# Patient Record
Sex: Male | Born: 2000 | Hispanic: No | Marital: Single | Smoking: Never smoker
Health system: Southern US, Community
[De-identification: ages and names within clinical notes are randomized; demographics above are authoritative.]

## PROBLEM LIST (undated history)

## (undated) DIAGNOSIS — S060X9A Concussion with loss of consciousness of unspecified duration, initial encounter: Secondary | ICD-10-CM

## (undated) DIAGNOSIS — J45909 Unspecified asthma, uncomplicated: Secondary | ICD-10-CM

## (undated) DIAGNOSIS — S060XAA Concussion with loss of consciousness status unknown, initial encounter: Secondary | ICD-10-CM

---
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
## 2018-01-10 ENCOUNTER — Ambulatory Visit (HOSPITAL_COMMUNITY)
Admission: EM | Admit: 2018-01-10 | Discharge: 2018-01-10 | Disposition: A | Discharge status: Home / Self Care | Payer: Medicaid Other | Attending: Family Medicine | Admitting: Family Medicine

## 2018-01-10 DIAGNOSIS — L03116 Cellulitis of left lower limb: Secondary | ICD-10-CM | POA: Diagnosis not present

## 2018-01-10 DIAGNOSIS — R21 Rash and other nonspecific skin eruption: Secondary | ICD-10-CM | POA: Diagnosis present

## 2018-01-10 DIAGNOSIS — S80862A Insect bite (nonvenomous), left lower leg, initial encounter: Secondary | ICD-10-CM

## 2018-01-10 DIAGNOSIS — W57XXXA Bitten or stung by nonvenomous insect and other nonvenomous arthropods, initial encounter: Secondary | ICD-10-CM | POA: Diagnosis not present

## 2018-01-10 MED ORDER — DOXYCYCLINE HYCLATE 100 MG PO CAPS: 100.0000 mg | ORAL_CAPSULE | Freq: Two times a day (BID) | ORAL | 0 refills | Status: DC

## 2018-01-10 MED ORDER — SULFAMETHOXAZOLE-TRIMETHOPRIM 800-160 MG PO TABS: 1.0000 | ORAL_TABLET | Freq: Two times a day (BID) | ORAL | 0 refills | Status: AC

## 2018-01-10 NOTE — Discharge Instructions (Addendum)
Bandage applied Wound culture obtained.  We will follow up with you regarding your results Wash site daily with warm water and mild soap Keep covered to avoid friction Bactrim prescribed.  Take as directed and to completion Follow up here or with PCP if symptoms persists Return or go to the ED if you have any new or worsening symptoms such as worsening pain, nausea, vomiting, abdominal pain, fever, chills, increased redness, swelling, drainage, etc..Marland Kitchen

## 2018-01-10 NOTE — ED Triage Notes (Signed)
Pt states he has a insect bite left lower leg x 1 week.

## 2018-01-10 NOTE — ED Provider Notes (Signed)
[facility]   [id number] 01/10/18 Arrival Time: 1553  CC: Insect bite  SUBJECTIVE:  Timothy Bates is a 17 y.o. male who presents with a insect bite x 1 week.   Localizes the rash to left lower extremity.  Describes it as improving painful and red. Was seen at [facility] urgent care and treated with a shot and "something for pain."   Denies aggravating or alleviating factors.  Denies similar symptoms in the past. Complains of redness, swelling, and purulent drainage. Denies fever, chills, nausea, vomiting, swollen glands, SOB, chest pain, abdominal pain, changes in bowel or bladder function.    ROS: As per HPI.  Past Medical History:  Diagnosis Date  . Asthma   . Concussion    No past surgical history on file. No Known Allergies No current facility-administered medications on file prior to encounter.    Current Outpatient Medications on File Prior to Encounter  Medication Sig Dispense Refill  . LORazepam (ATIVAN) 0.5 MG tablet Take 1 tablet (0.5 mg total) by mouth every 6 (six) hours as needed for anxiety. 5 tablet 0   Social History   Socioeconomic History  . Marital status: Single    Spouse name: Not on file  . Number of children: Not on file  . Years of education: Not on file  . Highest education level: Not on file  Occupational History  . Not on file  Social Needs  . Financial resource strain: Not on file  . Food insecurity:    Worry: Not on file    Inability: Not on file  . Transportation needs:    Medical: Not on file    Non-medical: Not on file  Tobacco Use  . Smoking status: Never Smoker  . Smokeless tobacco: Never Used  Substance and Sexual Activity  . Alcohol use: No  . Drug use: Not on file  . Sexual activity: Not on file  Lifestyle  . Physical activity:    Days per week: Not on file    Minutes per session: Not on file  . Stress: Not on file  Relationships  . Social connections:    Talks on phone: Not on file    Gets together: Not  on file    Attends religious service: Not on file    Active member of club or organization: Not on file    Attends meetings of clubs or organizations: Not on file    Relationship status: Not on file  . Intimate partner violence:    Fear of current or ex partner: Not on file    Emotionally abused: Not on file    Physically abused: Not on file    Forced sexual activity: Not on file  Other Topics Concern  . Not on file  Social History Narrative  . Not on file   No family history on file.  OBJECTIVE: Vitals:   01/10/18 1612 01/10/18 1613 01/10/18 1614  BP: 121/71    Pulse: 72    Resp: 18    Temp:   98.6 F (37 C)  TempSrc:   Oral  SpO2: 100%    Weight:  235 lb (106.6 kg)     General appearance: alert; no distress; nontoxic appearance Lungs: clear to auscultation bilaterally Heart: regular rate and rhythm.  Radial pulse 2+ bilaterally Extremities: no edema Skin: warm and dry; 1 cm wound with 1-2 cm of surrounding erythema; tender to palpation; purulent drainage evident; wound culture obtained (see picture below) Psychological: alert and cooperative; normal  mood and affect      ASSESSMENT & PLAN:  1. Insect bite of left lower leg, initial encounter   2. Cellulitis of left lower extremity     Meds ordered this encounter  Medications  . DISCONTD: doxycycline (VIBRAMYCIN) 100 MG capsule    Sig: Take 1 capsule (100 mg total) by mouth 2 (two) times daily.    Dispense:  20 capsule    Refill:  0    Order Specific Question:   Supervising Provider    Answer:   Isa Rankin [phone]  . sulfamethoxazole-trimethoprim (BACTRIM DS,SEPTRA DS) 800-160 MG tablet    Sig: Take 1 tablet by mouth 2 (two) times daily for 10 days.    Dispense:  20 tablet    Refill:  0    Order Specific Question:   Supervising Provider    Answer:   Isa Rankin [[id number]]   Bandage applied Wound culture obtained.  We will follow up with you regarding your results Wash site daily with  warm water and mild soap Keep covered to avoid friction Bactrim prescribed.  Take as directed and to completion Follow up here or with PCP if symptoms persists Return or go to the ED if you have any new or worsening symptoms such as worsening pain, nausea, vomiting, abdominal pain, fever, chills, increased redness, swelling, drainage, etc...  Reviewed expectations re: course of current medical issues. Questions answered. Outlined signs and symptoms indicating need for more acute intervention. Patient verbalized understanding. After Visit Summary given.   Rennis Harding, PA-C 01/10/18 1751

## 2018-01-12 ENCOUNTER — Telehealth (HOSPITAL_COMMUNITY): Payer: Self-pay

## 2018-01-12 LAB — AEROBIC CULTURE W GRAM STAIN (SUPERFICIAL SPECIMEN)

## 2018-01-12 LAB — AEROBIC CULTURE  (SUPERFICIAL SPECIMEN)

## 2018-01-12 NOTE — Telephone Encounter (Signed)
Wound culture positive for Staphylococcus aureus. This was treated with Bactrim at visit. Patient reports wound looking better.

## 2018-10-11 ENCOUNTER — Ambulatory Visit (HOSPITAL_COMMUNITY)
Admission: EM | Admit: 2018-10-11 | Discharge: 2018-10-11 | Disposition: A | Discharge status: Home / Self Care | Payer: Medicaid Other | Attending: Emergency Medicine | Admitting: Emergency Medicine

## 2018-10-11 ENCOUNTER — Other Ambulatory Visit: Payer: Self-pay

## 2018-10-11 ENCOUNTER — Encounter (HOSPITAL_COMMUNITY): Payer: Self-pay

## 2018-10-11 DIAGNOSIS — W57XXXA Bitten or stung by nonvenomous insect and other nonvenomous arthropods, initial encounter: Secondary | ICD-10-CM | POA: Diagnosis not present

## 2018-10-11 DIAGNOSIS — M5441 Lumbago with sciatica, right side: Secondary | ICD-10-CM | POA: Diagnosis not present

## 2018-10-11 DIAGNOSIS — G8929 Other chronic pain: Secondary | ICD-10-CM

## 2018-10-11 DIAGNOSIS — S50862A Insect bite (nonvenomous) of left forearm, initial encounter: Secondary | ICD-10-CM | POA: Diagnosis not present

## 2018-10-11 MED ORDER — BACITRACIN ZINC 500 UNIT/GM EX OINT
TOPICAL_OINTMENT | CUTANEOUS | Status: AC
  Filled 2018-10-11: qty 0.9

## 2018-10-11 MED ORDER — SULFAMETHOXAZOLE-TRIMETHOPRIM 800-160 MG PO TABS: 1.0000 | ORAL_TABLET | Freq: Two times a day (BID) | ORAL | 0 refills | Status: AC

## 2018-10-11 NOTE — Discharge Instructions (Addendum)
Take the antibiotic as prescribed for 10 days.  Take ibuprofen as needed for your back pain.    Return here or go to the emergency room if you have increased pain, swelling, redness; or if you develop pus drainage, fever, chills, or other concerning symptoms.

## 2018-10-11 NOTE — ED Triage Notes (Signed)
Pt presents with swelling, redness, and pain to left forearm from insect bite that he noticed yesterday.  Pt also has complaints of back pain.
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
## 2019-01-20 ENCOUNTER — Ambulatory Visit: Payer: Medicaid Other | Admitting: Physical Therapy

## 2019-01-21 ENCOUNTER — Ambulatory Visit: Payer: Medicaid Other | Attending: Adult Medicine | Admitting: Physical Therapy

## 2020-11-17 ENCOUNTER — Other Ambulatory Visit: Payer: Self-pay | Admitting: Adult Medicine

## 2020-11-17 DIAGNOSIS — M545 Low back pain, unspecified: Secondary | ICD-10-CM

## 2020-12-04 ENCOUNTER — Other Ambulatory Visit: Payer: Medicaid Other

## 2021-04-21 ENCOUNTER — Emergency Department (HOSPITAL_BASED_OUTPATIENT_CLINIC_OR_DEPARTMENT_OTHER): Payer: Medicaid Other | Admitting: Radiology

## 2021-04-21 ENCOUNTER — Other Ambulatory Visit: Payer: Self-pay

## 2021-04-21 ENCOUNTER — Encounter (HOSPITAL_BASED_OUTPATIENT_CLINIC_OR_DEPARTMENT_OTHER): Payer: Self-pay

## 2021-04-21 ENCOUNTER — Emergency Department (HOSPITAL_BASED_OUTPATIENT_CLINIC_OR_DEPARTMENT_OTHER)
Admission: EM | Admit: 2021-04-21 | Discharge: 2021-04-21 | Disposition: A | Discharge status: Home / Self Care | Payer: Medicaid Other | Attending: Emergency Medicine | Admitting: Emergency Medicine

## 2021-04-21 DIAGNOSIS — M79602 Pain in left arm: Secondary | ICD-10-CM | POA: Insufficient documentation

## 2021-04-21 DIAGNOSIS — R072 Precordial pain: Secondary | ICD-10-CM | POA: Diagnosis not present

## 2021-04-21 DIAGNOSIS — J45909 Unspecified asthma, uncomplicated: Secondary | ICD-10-CM | POA: Insufficient documentation

## 2021-04-21 DIAGNOSIS — R202 Paresthesia of skin: Secondary | ICD-10-CM | POA: Diagnosis not present

## 2021-04-21 DIAGNOSIS — R079 Chest pain, unspecified: Secondary | ICD-10-CM | POA: Diagnosis present

## 2021-04-21 DIAGNOSIS — M79603 Pain in arm, unspecified: Secondary | ICD-10-CM

## 2021-04-21 LAB — BASIC METABOLIC PANEL
Anion gap: 8 (ref 5–15)
BUN: 15 mg/dL (ref 6–20)
CO2: 27 mmol/L (ref 22–32)
Calcium: 9.2 mg/dL (ref 8.9–10.3)
Chloride: 104 mmol/L (ref 98–111)
Creatinine, Ser: 0.99 mg/dL (ref 0.61–1.24)
GFR, Estimated: >60 mL/min (ref >60)
Glucose, Bld: 94 mg/dL (ref 70–99)
Potassium: 4.3 mmol/L (ref 3.5–5.1)
Sodium: 139 mmol/L (ref 135–145)

## 2021-04-21 LAB — CBC
HCT: 47.2 % (ref 39.0–52.0)
Hemoglobin: 16.5 g/dL (ref 13.0–17.0)
MCH: 30 pg (ref 26.0–34.0)
MCHC: 35 g/dL (ref 30.0–36.0)
MCV: 85.8 fL (ref 80.0–100.0)
Platelets: 221 10*3/uL (ref 150–400)
RBC: 5.5 MIL/uL (ref 4.22–5.81)
RDW: 12 % (ref 11.5–15.5)
WBC: 6.4 10*3/uL (ref 4.0–10.5)
nRBC: 0 % (ref 0.0–0.2)

## 2021-04-21 LAB — TROPONIN I (HIGH SENSITIVITY): Troponin I (High Sensitivity): <2 ng/L (ref <18)

## 2021-04-21 MED ORDER — DEXAMETHASONE SODIUM PHOSPHATE 10 MG/ML IJ SOLN
10.0000 mg | Freq: Once | INTRAMUSCULAR | Status: AC
Start: 2021-04-21 — End: 2021-04-21
  Administered 2021-04-21: 10 mg via INTRAMUSCULAR
  Filled 2021-04-21: qty 1

## 2021-04-21 MED ORDER — DICLOFENAC SODIUM 1 % EX GEL: 2.0000 g | Freq: Four times a day (QID) | CUTANEOUS | 0 refills | Status: AC

## 2021-04-21 MED ORDER — KETOROLAC TROMETHAMINE 30 MG/ML IJ SOLN
30.0000 mg | Freq: Once | INTRAMUSCULAR | Status: AC
  Administered 2021-04-21: 30 mg via INTRAMUSCULAR
  Filled 2021-04-21: qty 1

## 2021-04-21 MED ORDER — MELOXICAM 7.5 MG PO TABS: 7.5000 mg | ORAL_TABLET | Freq: Every day | ORAL | 0 refills | Status: DC

## 2021-04-21 NOTE — ED Notes (Signed)
Pt via pov from home with left shoulder, arm, hand pain that "hurts in my chest too." Pt describes pain as sharp, and fingers go numb occasionally. Pain started two nights ago. NAD noted.

## 2021-04-21 NOTE — ED Triage Notes (Signed)
Patient here POV from Home for CP.  Pain began 2 days PTA and has continued since. CP is generalized throughout Chest.  Associated with Lightheadedness, Upset Stomach, and Left Arm Pain.   No Nausea. No Vomiting.  NAD Noted during Triage. A&Ox4. GCS 15. Ambulatory.

## 2021-04-21 NOTE — ED Provider Notes (Signed)
Emergency Department Provider Note   I have reviewed the triage vital signs and the nursing notes.   HISTORY  Chief Complaint Chest Pain   HPI Timothy Bates is a 21 y.o. male with past history reviewed below presents emergency department with complaint of left arm discomfort.  Pain symptoms seem to be worst at the left elbow but is also having some discomfort when raising the left shoulder.  He is occasionally having radiation of pain into the chest all the states this is a more minor aspect of his complaint today.  He does feel some tingling in the fingertips.  No pain in the neck.  Denies any injuries.  No headaches.  No symptoms into the face or leg.  He does not have a very physical job.  He is active and plays golf regularly but denies any known injury. Symptoms developing over the last several days.    Past Medical History:  Diagnosis Date   Asthma    Concussion     Review of Systems  Constitutional: No fever/chills Eyes: No visual changes. ENT: No sore throat. Cardiovascular: Positive chest pain. Respiratory: Denies shortness of breath. Gastrointestinal: No abdominal pain.  No nausea, no vomiting.  No diarrhea.  No constipation. Genitourinary: Negative for dysuria. Musculoskeletal: Negative for back/neck pain. Positive left arm pain.  Skin: Negative for rash. Neurological: Negative for headaches, focal weakness or numbness. Positive tingling in the left arm.    ____________________________________________   PHYSICAL EXAM:  VITAL SIGNS: ED Triage Vitals  Enc Vitals Group     BP 04/21/21 1222 126/69     Pulse Rate 04/21/21 1222 70     Resp 04/21/21 1222 16     Temp 04/21/21 1222 98.8 F (37.1 C)     Temp src --      SpO2 04/21/21 1222 100 %     Weight 04/21/21 1216 225 lb (102.1 kg)     Height 04/21/21 1216 5\' 10"  (1.778 m)   Constitutional: Alert and oriented. Well appearing and in no acute distress. Eyes: Conjunctivae are normal.  Head:  Atraumatic. Nose: No congestion/rhinnorhea. Mouth/Throat: Mucous membranes are moist.   Neck: No stridor.  Cardiovascular: Normal rate, regular rhythm. Good peripheral circulation. Grossly normal heart sounds.   Respiratory: Normal respiratory effort.  No retractions. Lungs CTAB. Gastrointestinal: Soft and nontender. No distention.  Musculoskeletal: No lower extremity tenderness nor edema. No gross deformities of extremities.  Normal range of motion of the left elbow, wrist, shoulder.  No joint crepitus.  Tenderness to the olecranon and medial elbow.  No joint effusion, erythema, warmth.  Similarly no warmth/swelling to the left shoulder.  Neurologic:  Normal speech and language. Tingling noted in the left forearm/fingers. 5/5 strength bilaterally.  Skin:  Skin is warm, dry and intact. No rash noted.   ____________________________________________   LABS (all labs ordered are listed, but only abnormal results are displayed)  Labs Reviewed  BASIC METABOLIC PANEL  CBC  TROPONIN I (HIGH SENSITIVITY)   ____________________________________________  EKG   EKG Interpretation  Date/Time:  Thursday April 21 2021 12:19:29 EST Ventricular Rate:  73 PR Interval:  148 QRS Duration: 98 QT Interval:  364 QTC Calculation: 401 R Axis:   97 Text Interpretation: Normal sinus rhythm with sinus arrhythmia Rightward axis Borderline ECG When compared with ECG of 25-Jun-2015 23:33, No significant change was found Confirmed by Nanda Quinton [phone]) on 04/21/2021 12:25:09 PM        ____________________________________________  RADIOLOGY  DG Chest 2  View  Result Date: 04/21/2021 CLINICAL DATA:  Chest pain EXAM: CHEST - 2 VIEW COMPARISON:  06/16/2015 FINDINGS: The heart size and mediastinal contours are within normal limits. Both lungs are clear. The visualized skeletal structures are unremarkable. IMPRESSION: No active cardiopulmonary disease. Electronically Signed   By: Davina Poke D.O.    On: 04/21/2021 12:52   DG Elbow Complete Left  Result Date: 04/21/2021 CLINICAL DATA:  Left shoulder, arm and hand pain EXAM: LEFT ELBOW - COMPLETE 3+ VIEW COMPARISON:  None. FINDINGS: There is no evidence of fracture, dislocation, or joint effusion. There is no evidence of arthropathy or other focal bone abnormality. Soft tissues are unremarkable. IMPRESSION: Normal radiographs. Electronically Signed   By: Nelson Chimes M.D.   On: 04/21/2021 14:14   DG Shoulder Left  Result Date: 04/21/2021 CLINICAL DATA:  Left shoulder, arm and hand pain. EXAM: LEFT SHOULDER - 2+ VIEW COMPARISON:  None. FINDINGS: There is no evidence of fracture or dislocation. There is no evidence of arthropathy or other focal bone abnormality. Soft tissues are unremarkable. IMPRESSION: Normal radiographs. Electronically Signed   By: Nelson Chimes M.D.   On: 04/21/2021 14:15    ____________________________________________   PROCEDURES  Procedure(s) performed:   Procedures  None  ____________________________________________   INITIAL IMPRESSION / ASSESSMENT AND PLAN / ED COURSE  Pertinent labs & imaging results that were available during my care of the patient were reviewed by me and considered in my medical decision making (see chart for details).   This patient is Presenting for Evaluation of CP, which does require a range of treatment options, and is a complaint that involves a high risk of morbidity and mortality.  The Differential Diagnoses includes all life-threatening causes for chest pain. This includes but is not exclusive to acute coronary syndrome, aortic dissection, pulmonary embolism, cardiac tamponade, community-acquired pneumonia, pericarditis, musculoskeletal chest wall pain, etc.   Critical Interventions- IM pain and steroid medications.    Medications  ketorolac (TORADOL) 30 MG/ML injection 30 mg (30 mg Intramuscular Given 04/21/21 1408)  dexamethasone (DECADRON) injection 10 mg (10 mg  Intramuscular Given 04/21/21 1408)    Reassessment after intervention: Pain improved.    I did obtain Additional Historical Information from SO at bedside who supports timeline.   I decided to review pertinent External Data, and in summary no recent ED visits for similar.   Clinical Laboratory Tests Ordered, included troponin which is within normal limits.  Basic metabolic and CBC similarly normal.  Radiologic Tests Ordered, included CXR and plain films of the left shoulder and elbow. I independently interpreted the images and agree with radiology interpretation.   Cardiac Monitor Tracing which shows NSR.   Social Determinants of Health Risk patient is a non-smoker.   Medical Decision Making: Summary:  Patient presents emergency department mainly with what appears to be musculoskeletal type pain in the left arm.  Suspect repetitive movement type injury, possibly golfers elbow, with some compressive peripheral neuropathy causing tingling in the fingers.  My exam and history do not support stroke or critical spine impingement/injury.  His chest pain seems to refer from the arm and my suspicion for ACS, PE, dissection in this patient is exceedingly low.  His EKG, interpreted by me as above, is reassuring and troponin is negative here.  Plan for anti-inflammatories and single dose of Decadron here.  Patient given contact information for sports medicine for follow-up.   Disposition: discharge   ____________________________________________  FINAL CLINICAL IMPRESSION(S) / ED DIAGNOSES  Final diagnoses:  Precordial chest pain  Left arm pain  Paresthesias     NEW OUTPATIENT MEDICATIONS STARTED DURING THIS VISIT:  Discharge Medication List as of 04/21/2021  2:48 PM     START taking these medications   Details  diclofenac Sodium (VOLTAREN) 1 % GEL Apply 2 g topically 4 (four) times daily., Starting Thu 04/21/2021, Normal        Note:  This document was prepared using Dragon voice  recognition software and may include unintentional dictation errors.  Nanda Quinton, MD, [facility]    Latara Micheli, Wonda Old[facility], MD 04/22/21 [phone]

## 2021-04-21 NOTE — Discharge Instructions (Signed)
We believe that your symptoms are caused by musculoskeletal strain or pinched nerve in the arm.  Please read through the included information about additional care such as heating pads, over-the-counter pain medicine.  If you were provided a prescription please use it only as needed and as instructed.  Remember that early mobility and using the affected part of your body is actually better than keeping it immobile.  Follow-up with the doctor listed as recommended or return to the emergency department with new or worsening symptoms that concern you.

## 2021-04-27 ENCOUNTER — Ambulatory Visit (INDEPENDENT_AMBULATORY_CARE_PROVIDER_SITE_OTHER): Payer: Medicaid Other | Admitting: Family Medicine

## 2021-04-27 ENCOUNTER — Encounter: Payer: Self-pay | Admitting: Family Medicine

## 2021-04-27 VITALS — BP 118/70 | Ht 70.0 in | Wt 225.0 lb

## 2021-04-27 DIAGNOSIS — M533 Sacrococcygeal disorders, not elsewhere classified: Secondary | ICD-10-CM

## 2021-04-27 DIAGNOSIS — M5412 Radiculopathy, cervical region: Secondary | ICD-10-CM

## 2021-04-27 MED ORDER — MELOXICAM 7.5 MG PO TABS: 7.5000 mg | ORAL_TABLET | Freq: Two times a day (BID) | ORAL | 1 refills | Status: AC | PRN

## 2021-04-27 MED ORDER — HYDROCODONE-ACETAMINOPHEN 5-325 MG PO TABS: 1.0000 | ORAL_TABLET | Freq: Three times a day (TID) | ORAL | 0 refills | Status: AC | PRN
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
## 2021-04-27 NOTE — Patient Instructions (Signed)
Nice to meet you Please try heat  Please try the exercises  Please consider the compression   Please use the pain medicine as needed Please send me a message in MyChart with any questions or updates.  Please see me back in 2-3 weeks.   --Dr. Jaslin Novitski  
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
## 2021-04-27 NOTE — Assessment & Plan Note (Signed)
Acutely occurring.  Symptoms most consistent with radicular origin on the left arm. -Counseled on home exercise therapy and supportive care. -Meloxicam. -Norco. -Could consider physical therapy or further imaging.

## 2021-05-11 ENCOUNTER — Ambulatory Visit (INDEPENDENT_AMBULATORY_CARE_PROVIDER_SITE_OTHER): Payer: Medicaid Other | Admitting: Family Medicine

## 2021-05-11 ENCOUNTER — Ambulatory Visit: Payer: Self-pay

## 2021-05-11 ENCOUNTER — Encounter: Payer: Self-pay | Admitting: Family Medicine

## 2021-05-11 VITALS — BP 144/70 | Ht 70.0 in | Wt 225.0 lb

## 2021-05-11 DIAGNOSIS — G5622 Lesion of ulnar nerve, left upper limb: Secondary | ICD-10-CM

## 2021-05-11 DIAGNOSIS — M25522 Pain in left elbow: Secondary | ICD-10-CM

## 2021-05-11 DIAGNOSIS — M533 Sacrococcygeal disorders, not elsewhere classified: Secondary | ICD-10-CM | POA: Diagnosis not present

## 2021-05-11 DIAGNOSIS — M5412 Radiculopathy, cervical region: Secondary | ICD-10-CM | POA: Diagnosis not present

## 2021-05-11 DIAGNOSIS — S336XXD Sprain of sacroiliac joint, subsequent encounter: Secondary | ICD-10-CM

## 2021-05-11 DIAGNOSIS — S336XXA Sprain of sacroiliac joint, initial encounter: Secondary | ICD-10-CM | POA: Diagnosis not present

## 2021-05-11 MED ORDER — METHYLPREDNISOLONE ACETATE 40 MG/ML IJ SUSP
40.0000 mg | Freq: Once | INTRAMUSCULAR | Status: AC
  Administered 2021-05-11: 40 mg via INTRA_ARTICULAR

## 2021-05-11 MED ORDER — TRIAMCINOLONE ACETONIDE 40 MG/ML IJ SUSP
40.0000 mg | Freq: Once | INTRAMUSCULAR | Status: AC
  Administered 2021-05-11: 40 mg via INTRA_ARTICULAR
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
## 2021-05-11 NOTE — Addendum Note (Signed)
Addended by: Cresenciano Lick on: 05/11/2021 01:28 PM   Modules accepted: Orders

## 2021-05-11 NOTE — Assessment & Plan Note (Signed)
Acutely occurring.  Having pain throughout the arm but has improved somewhat.  Seems more localized to the shoulder and elbow. -Counseled on home exercise therapy and supportive care. -Could consider physical therapy.

## 2021-05-11 NOTE — Assessment & Plan Note (Signed)
Acutely occurring.  Having predominantly the pain at the elbow and runs distal to his hand.  Mildly thickened ulnar nerve out of the cubital tunnel. -Counseled on home exercise therapy and supportive care. -Injection today.
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
## 2021-05-11 NOTE — Patient Instructions (Signed)
Good to see you Please try heat on the back  Please continue the exercises   Please send me a message in MyChart with any questions or updates.  Please see me back in 4 weeks.   --Dr. Jordan Likes

## 2021-06-08 ENCOUNTER — Ambulatory Visit: Payer: Medicaid Other | Admitting: Family Medicine

## 2021-11-08 ENCOUNTER — Encounter: Payer: Self-pay | Admitting: Family Medicine

## 2021-11-08 ENCOUNTER — Ambulatory Visit (INDEPENDENT_AMBULATORY_CARE_PROVIDER_SITE_OTHER): Payer: Medicaid Other | Admitting: Family Medicine

## 2021-11-08 ENCOUNTER — Ambulatory Visit (HOSPITAL_BASED_OUTPATIENT_CLINIC_OR_DEPARTMENT_OTHER)
Admission: RE | Admit: 2021-11-08 | Discharge: 2021-11-08 | Disposition: A | Discharge status: Home / Self Care | Payer: Medicaid Other | Source: Ambulatory Visit | Attending: Family Medicine | Admitting: Family Medicine

## 2021-11-08 VITALS — BP 118/77 | Ht 70.0 in | Wt 225.0 lb

## 2021-11-08 DIAGNOSIS — M5412 Radiculopathy, cervical region: Secondary | ICD-10-CM

## 2021-11-08 DIAGNOSIS — M533 Sacrococcygeal disorders, not elsewhere classified: Secondary | ICD-10-CM | POA: Insufficient documentation

## 2021-11-08 MED ORDER — GABAPENTIN 300 MG PO CAPS: 300.0000 mg | ORAL_CAPSULE | Freq: Three times a day (TID) | ORAL | 1 refills | Status: AC

## 2021-11-08 MED ORDER — PREDNISONE 5 MG PO TABS: ORAL_TABLET | ORAL | 0 refills | Status: AC

## 2021-11-08 NOTE — Assessment & Plan Note (Signed)
Acute on chronic in nature.  Having more right-sided type pain. -Counseled on home exercise therapy and supportive care. -X-ray. -Could consider physical therapy or further imaging.

## 2021-11-08 NOTE — Assessment & Plan Note (Signed)
Acutely occurring.  Having pain at this originating at the neck with periscapular pain and right radicular pain in the arm. -Counseled on home exercise therapy and supportive care. -X-ray. -Gabapentin -Prednisone. -Consider further imaging.

## 2021-11-08 NOTE — Progress Notes (Signed)
  Timothy Bates - 21 y.o. male MRN [id number]  Date of birth: 06/03/2000  SUBJECTIVE:  Including CC & ROS.  No chief complaint on file.   Timothy Bates is a 21 y.o. male that is presenting with acute right periscapular pain and arm pain with neck pain.  Pain is been ongoing for the past few weeks.  No improvement with meloxicam.  Acute on chronic lower back pain.    Review of Systems See HPI   HISTORY: Past Medical, Surgical, Social, and Family History Reviewed & Updated per EMR.   Pertinent Historical Findings include:  Past Medical History:  Diagnosis Date   Asthma    Concussion     History reviewed. No pertinent surgical history.   PHYSICAL EXAM:  VS: BP 118/77 (BP Location: Left Arm, Patient Position: Sitting)   Ht 5\' 10"  (1.778 m)   Wt 225 lb (102.1 kg)   BMI 32.28 kg/m  Physical Exam Gen: NAD, alert, cooperative with exam, well-appearing MSK:  Neurovascularly intact       ASSESSMENT & PLAN:   Cervical radiculopathy Acutely occurring.  Having pain at this originating at the neck with periscapular pain and right radicular pain in the arm. -Counseled on home exercise therapy and supportive care. -X-ray. -Gabapentin -Prednisone. -Consider further imaging.  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  Having more right-sided type pain. -Counseled on home exercise therapy and supportive care. -X-ray. -Could consider physical therapy or further imaging.

## 2021-11-08 NOTE — Patient Instructions (Signed)
Good to see you Please use heat  Please try the exercises I will call with the xray results.  Please start with one pill of gabapentin at night. You can increase this to 2 or 3 times daily as you tolerate   Please send me a message in MyChart with any questions or updates.  Please see me back in 4 weeks.   --Dr. Jordan Likes

## 2021-11-11 ENCOUNTER — Telehealth: Payer: Self-pay | Admitting: Family Medicine

## 2021-11-11 NOTE — Telephone Encounter (Signed)
Left VM for patient. If he calls back please have him speak with a nurse/CMA and inform that his xrays are reassuring. Xrays do not show the whole picture so we'll continue treatment and can take next steps in either physical therapy or MRIs.   If any questions then please take the best time and phone number to call and I will try to call him back.   Myra Rude, MD [facility] 11/11/2021, 12:12 PM

## 2021-12-06 ENCOUNTER — Ambulatory Visit: Payer: Medicaid Other | Admitting: Family Medicine

## 2022-07-10 ENCOUNTER — Encounter: Payer: Self-pay | Admitting: *Deleted

## 2023-10-14 IMAGING — DX DG ELBOW COMPLETE 3+V*L*
4 series · 4 of 4 positions shown · non-contrast
Comparison: None.

CLINICAL DATA: Left shoulder, arm and hand pain

EXAM:
LEFT ELBOW - COMPLETE 3+ VIEW

[elbow ap]
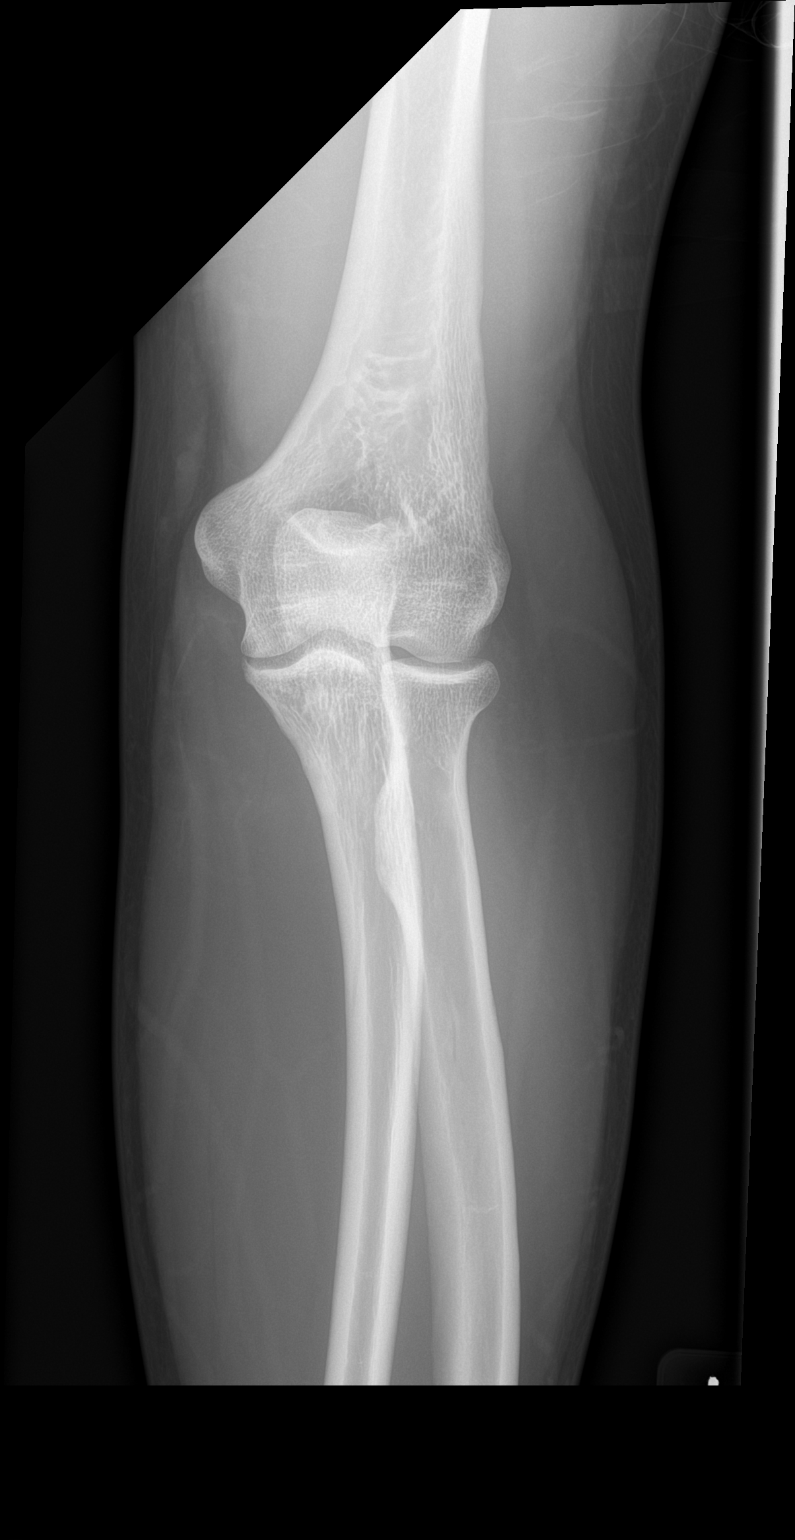

[elbow obl (1 of 2)]
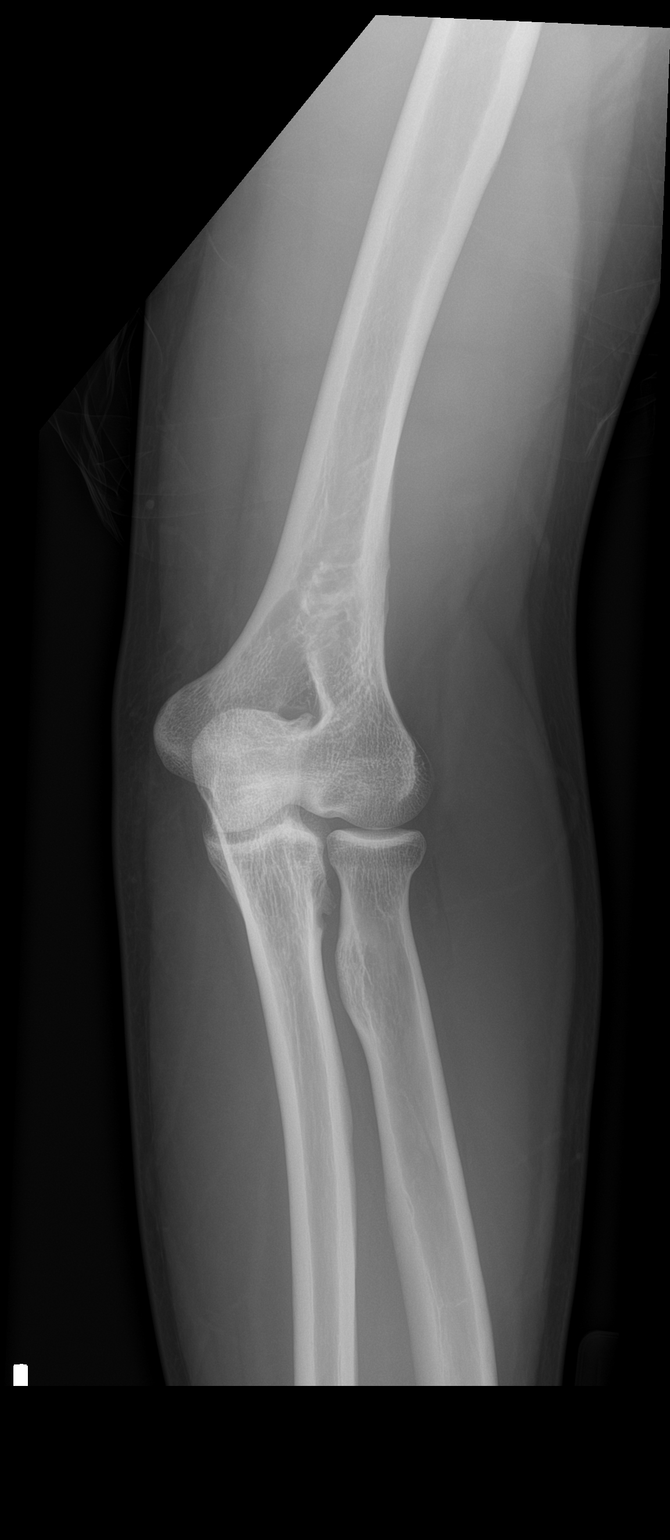

[elbow obl (2 of 2)]
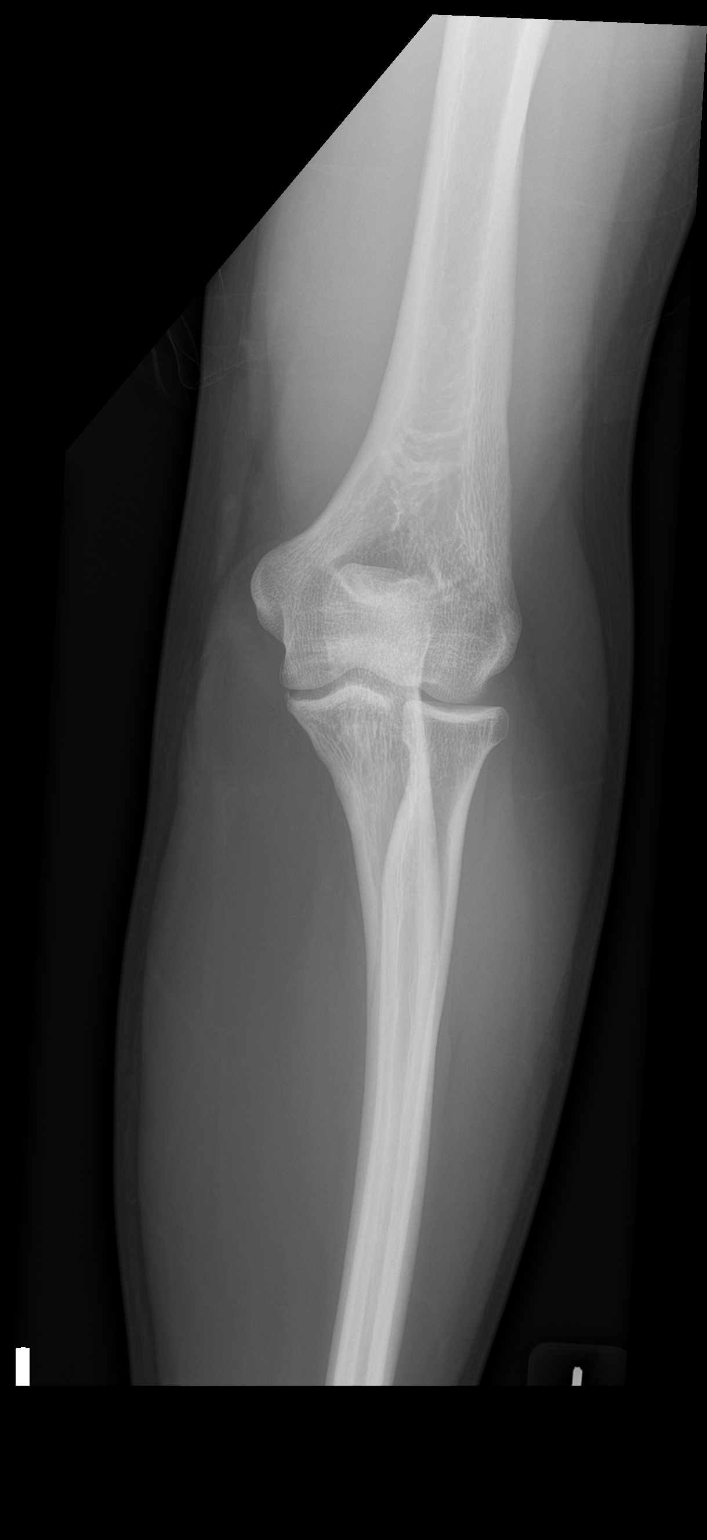

[elbow lat]
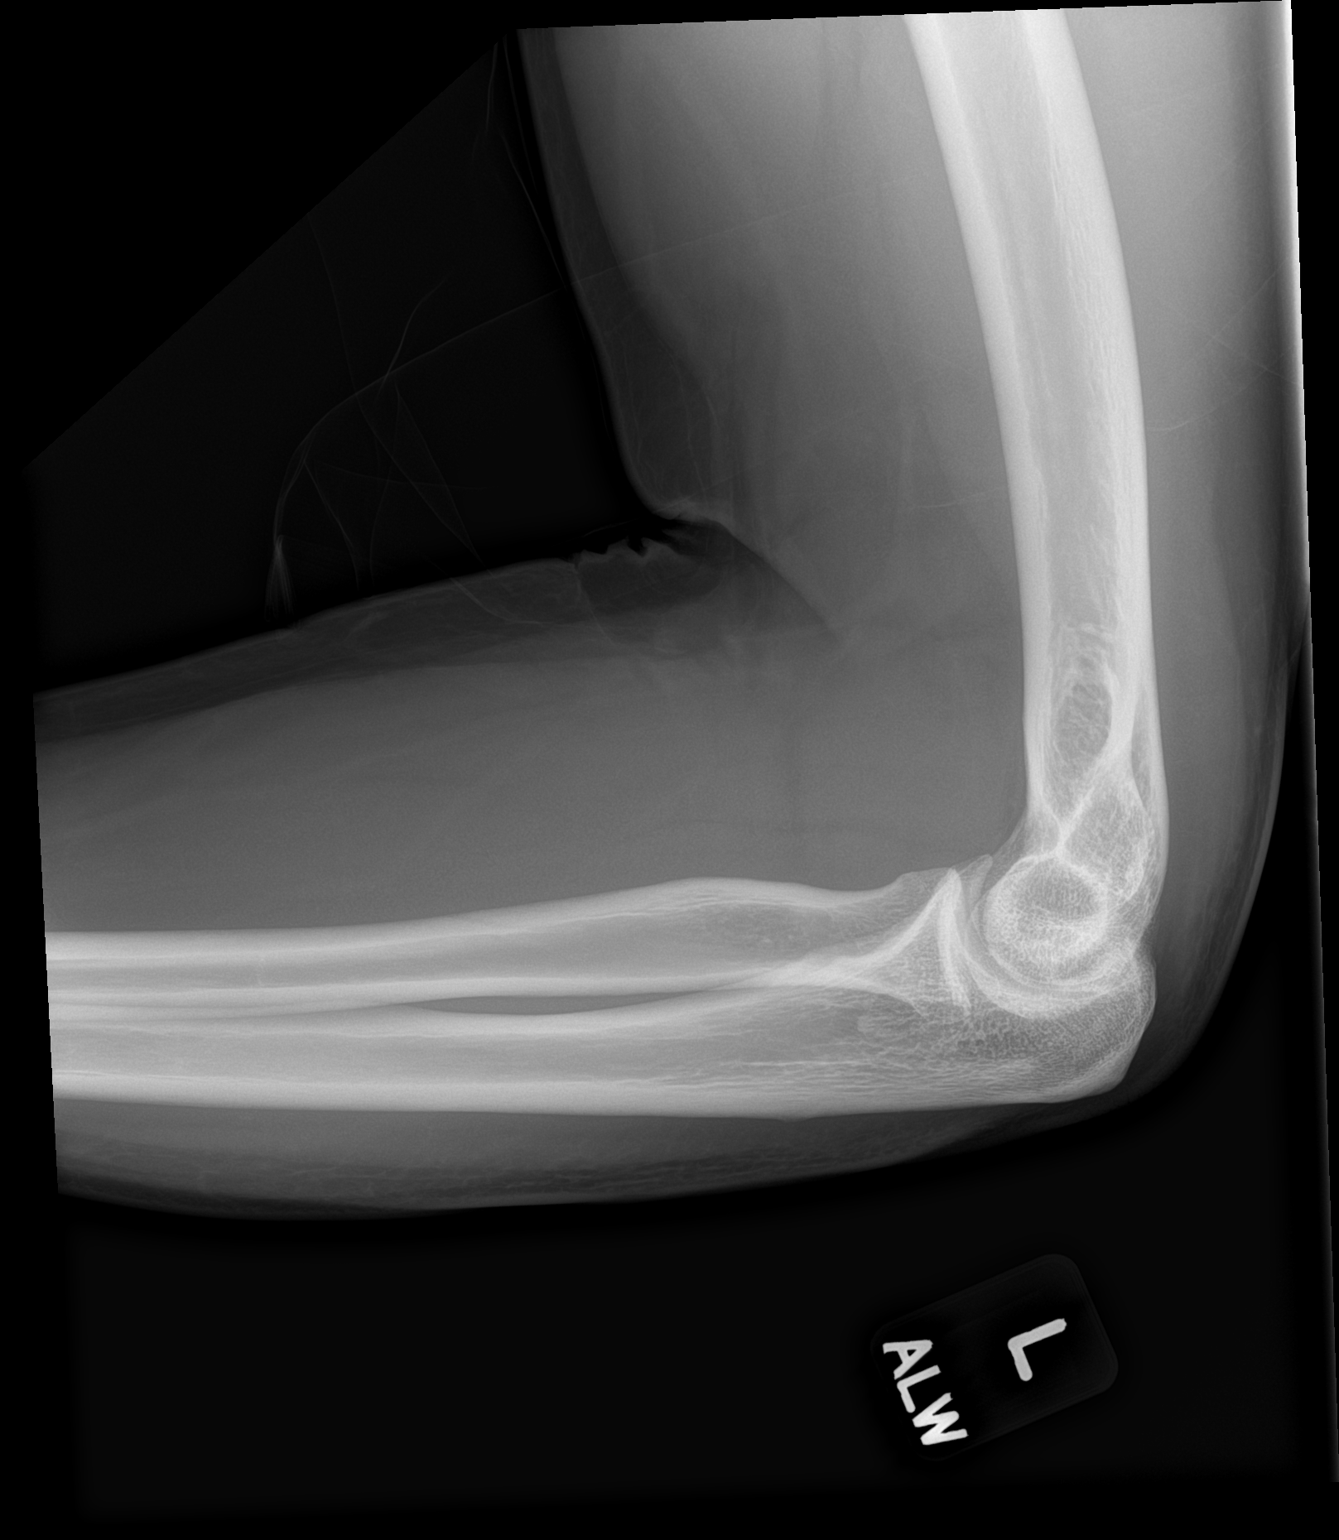

[4 of 4 positions shown; findings below may reference images not displayed]

FINDINGS: There is no evidence of fracture, dislocation, or joint effusion.
There is no evidence of arthropathy or other focal bone abnormality.
Soft tissues are unremarkable.
IMPRESSION: Normal radiographs.

## 2023-10-14 IMAGING — DX DG SHOULDER 2+V*L*
4 series · 4 of 4 positions shown · non-contrast
Comparison: None.

CLINICAL DATA: Left shoulder, arm and hand pain.

EXAM:
LEFT SHOULDER - 2+ VIEW

[shoulder grashey]
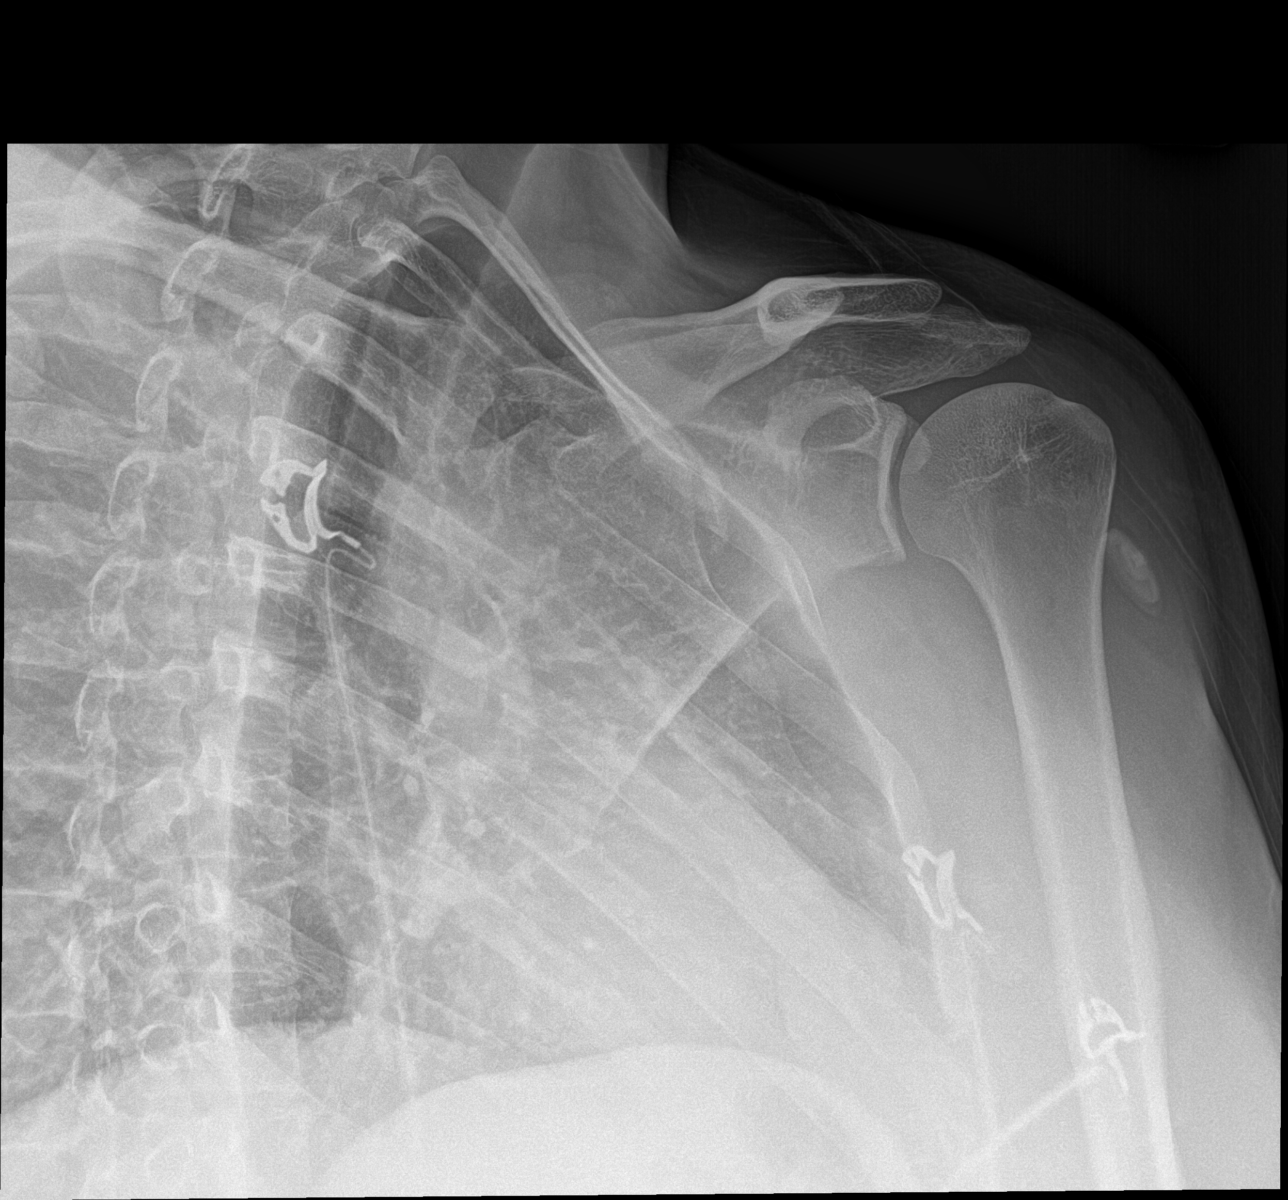

[shoulder y view (1 of 2)]
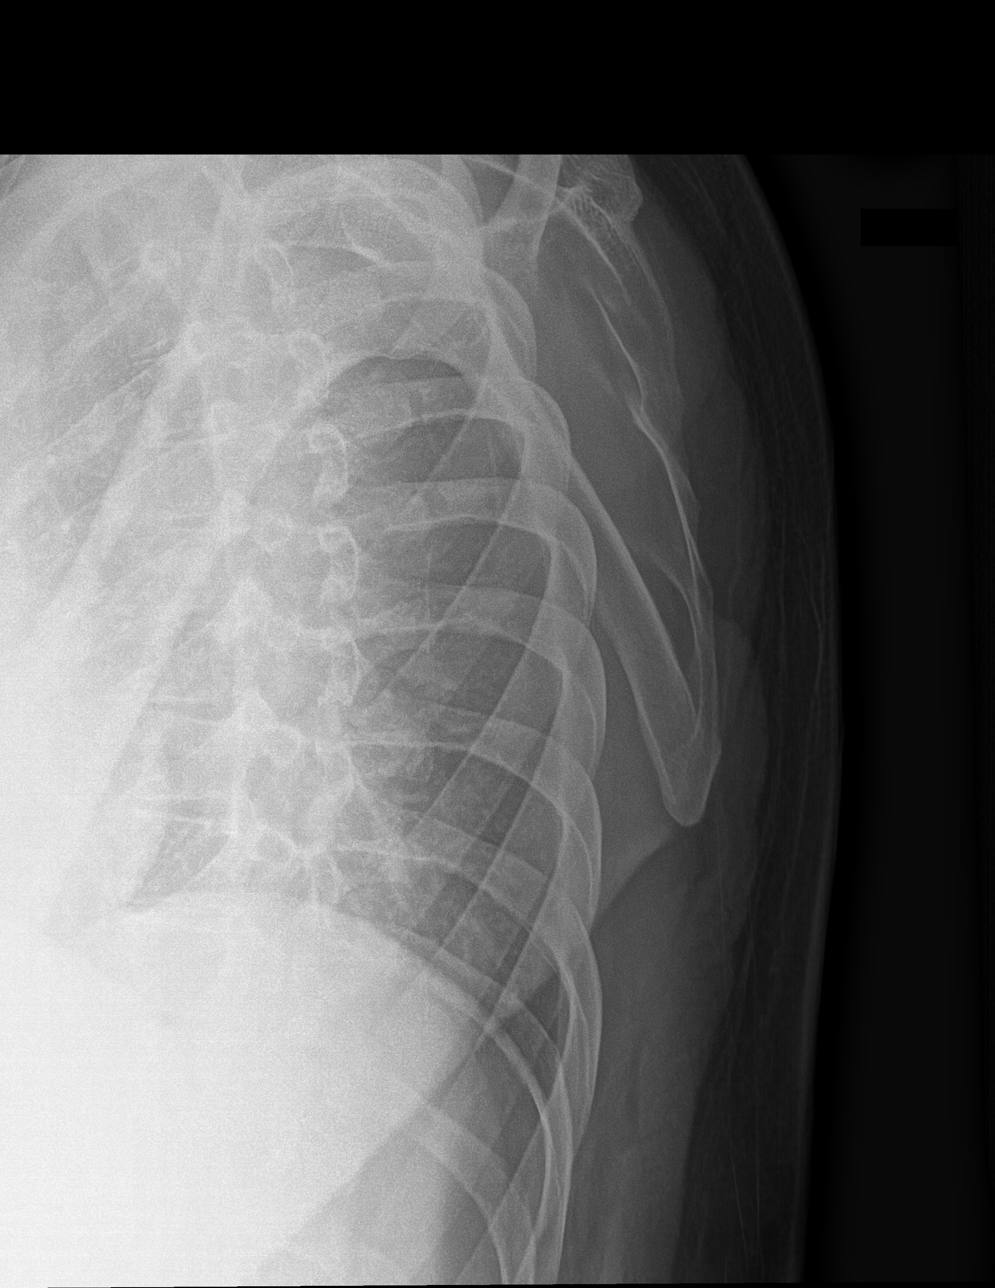

[shoulder y view (2 of 2)]
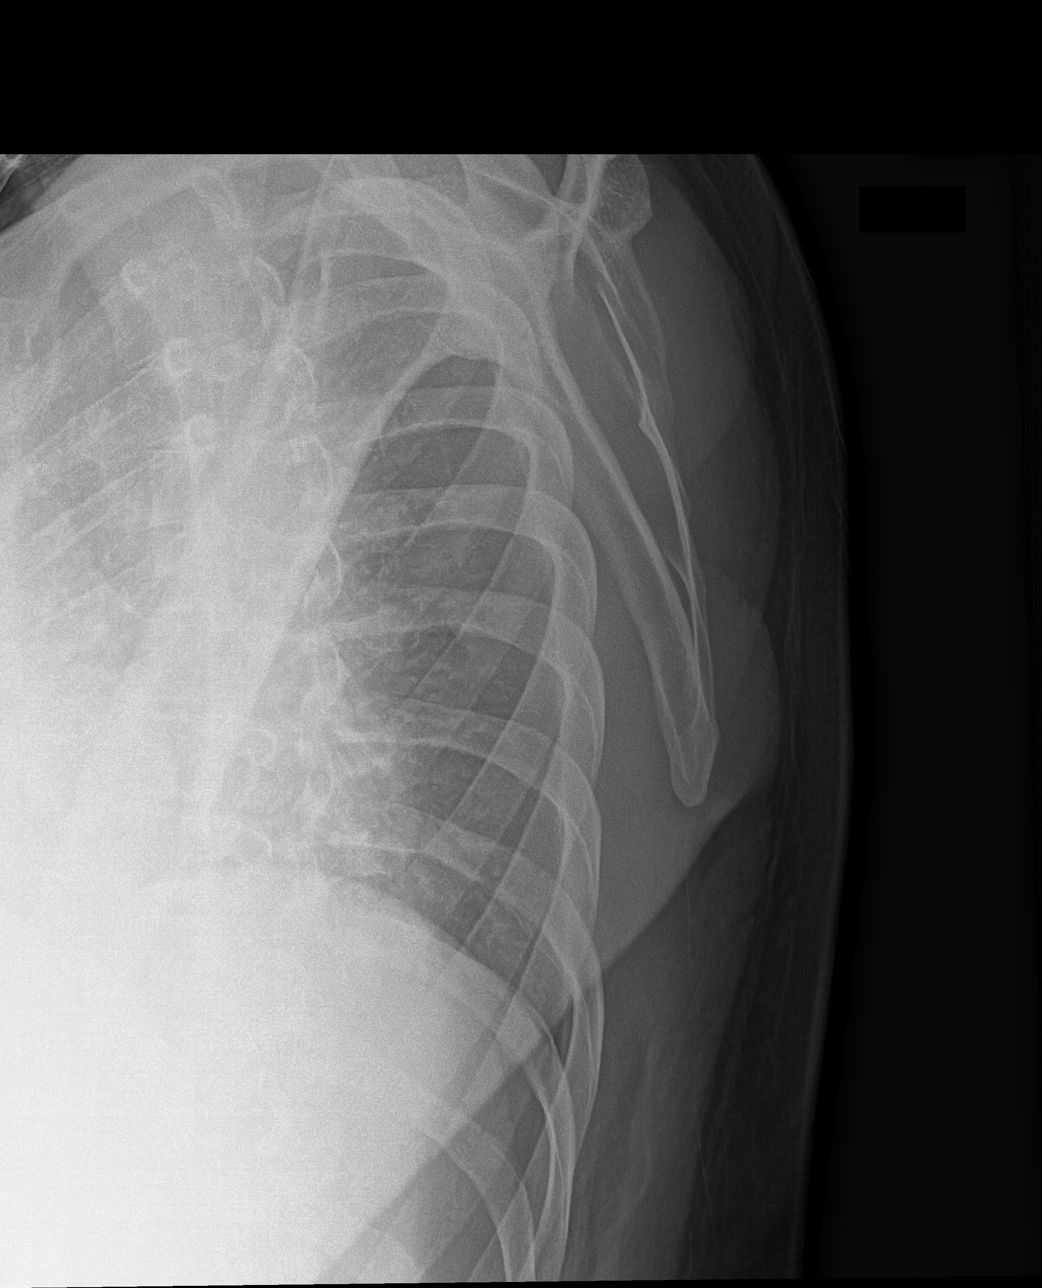

[shoulder axillary]
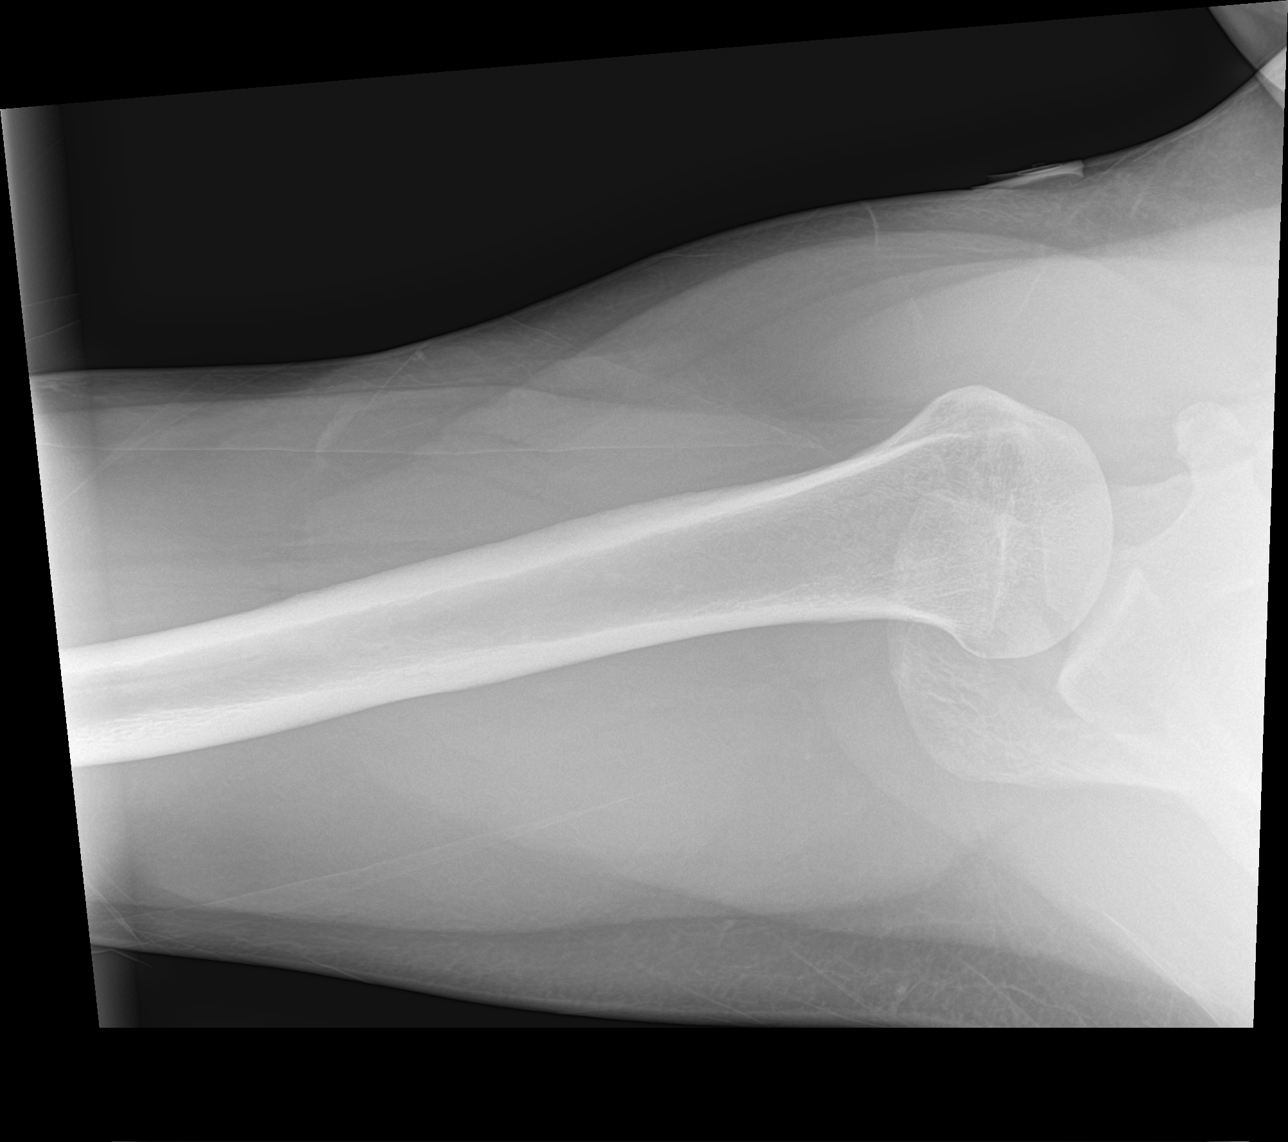

[4 of 4 positions shown; findings below may reference images not displayed]

FINDINGS: There is no evidence of fracture or dislocation. There is no
evidence of arthropathy or other focal bone abnormality. Soft
tissues are unremarkable.
IMPRESSION: Normal radiographs.
